# Patient Record
Sex: Male | Born: 1955 | Race: White | Hispanic: No | Marital: Single | Smoking: Former smoker
Health system: Southern US, Community
[De-identification: ages and names within clinical notes are randomized; demographics above are authoritative.]

## PROBLEM LIST (undated history)

## (undated) DIAGNOSIS — K5792 Diverticulitis of intestine, part unspecified, without perforation or abscess without bleeding: Secondary | ICD-10-CM

## (undated) DIAGNOSIS — G4730 Sleep apnea, unspecified: Secondary | ICD-10-CM

## (undated) DIAGNOSIS — E041 Nontoxic single thyroid nodule: Secondary | ICD-10-CM

## (undated) HISTORY — DX: Diverticulitis of intestine, part unspecified, without perforation or abscess without bleeding: K57.92

## (undated) HISTORY — DX: Nontoxic single thyroid nodule: E04.1

## (undated) HISTORY — PX: HERNIA REPAIR: SHX51

---

## 2012-02-11 ENCOUNTER — Other Ambulatory Visit: Payer: Self-pay | Admitting: Endocrinology

## 2012-02-11 DIAGNOSIS — E041 Nontoxic single thyroid nodule: Secondary | ICD-10-CM

## 2012-02-14 ENCOUNTER — Ambulatory Visit
Admission: RE | Admit: 2012-02-14 | Discharge: 2012-02-14 | Disposition: A | Discharge status: Home / Self Care | Payer: BC Managed Care – PPO | Source: Ambulatory Visit | Attending: Endocrinology | Admitting: Endocrinology

## 2012-02-14 ENCOUNTER — Other Ambulatory Visit (HOSPITAL_COMMUNITY)
Admission: RE | Admit: 2012-02-14 | Discharge: 2012-02-14 | Disposition: A | Discharge status: Home / Self Care | Payer: BC Managed Care – PPO | Source: Ambulatory Visit | Attending: Physician Assistant | Admitting: Physician Assistant

## 2012-02-14 DIAGNOSIS — E041 Nontoxic single thyroid nodule: Secondary | ICD-10-CM

## 2012-02-14 DIAGNOSIS — E049 Nontoxic goiter, unspecified: Secondary | ICD-10-CM | POA: Insufficient documentation

## 2012-03-19 ENCOUNTER — Other Ambulatory Visit: Payer: Self-pay | Admitting: Internal Medicine

## 2012-03-19 DIAGNOSIS — F17200 Nicotine dependence, unspecified, uncomplicated: Secondary | ICD-10-CM

## 2012-03-24 ENCOUNTER — Other Ambulatory Visit: Payer: Self-pay | Admitting: Internal Medicine

## 2012-03-24 DIAGNOSIS — Z1211 Encounter for screening for malignant neoplasm of colon: Secondary | ICD-10-CM

## 2012-03-31 ENCOUNTER — Other Ambulatory Visit: Payer: BC Managed Care – PPO

## 2012-03-31 ENCOUNTER — Ambulatory Visit
Admission: RE | Admit: 2012-03-31 | Discharge: 2012-03-31 | Disposition: A | Discharge status: Home / Self Care | Payer: BC Managed Care – PPO | Source: Ambulatory Visit | Attending: Internal Medicine | Admitting: Internal Medicine

## 2012-03-31 DIAGNOSIS — F17200 Nicotine dependence, unspecified, uncomplicated: Secondary | ICD-10-CM

## 2012-04-14 ENCOUNTER — Ambulatory Visit
Admission: RE | Admit: 2012-04-14 | Discharge: 2012-04-14 | Disposition: A | Discharge status: Home / Self Care | Payer: BC Managed Care – PPO | Source: Ambulatory Visit | Attending: Internal Medicine | Admitting: Internal Medicine

## 2012-04-14 DIAGNOSIS — Z1211 Encounter for screening for malignant neoplasm of colon: Secondary | ICD-10-CM

## 2012-06-10 ENCOUNTER — Other Ambulatory Visit: Payer: Self-pay | Admitting: Endocrinology

## 2012-06-10 DIAGNOSIS — E041 Nontoxic single thyroid nodule: Secondary | ICD-10-CM

## 2012-09-09 ENCOUNTER — Ambulatory Visit (INDEPENDENT_AMBULATORY_CARE_PROVIDER_SITE_OTHER): Payer: BC Managed Care – PPO | Admitting: Pulmonary Disease

## 2012-09-09 ENCOUNTER — Encounter: Payer: Self-pay | Admitting: Pulmonary Disease

## 2012-09-09 VITALS — BP 110/80 | HR 96 | Temp 98.1°F | Ht 72.0 in | Wt 273.0 lb

## 2012-09-09 DIAGNOSIS — G4733 Obstructive sleep apnea (adult) (pediatric): Secondary | ICD-10-CM

## 2012-09-09 NOTE — Progress Notes (Signed)
  Subjective:    Patient ID: Jonathan Hart, male    DOB: 07-May-1956, 56 y.o.   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 His main complaint is excessive daytime somnolence & fatigue. ESS 13/24. Reports naps when able. Reports sleepiness while watching TV, sitting & reading or lying down to rest i the afternoons. Bedtime is 11p-1A, latency 15-30 mins, sleeps on his back x 1 pillow, wakes up 3 times for BR visits, oob by 0900 feeling tired occasionally, with dryness of mouth , denies headaches. He has gained 30 lbs over the last 2 yrs. There is no history suggestive of cataplexy, sleep paralysis or parasomnias  He has a thyroid nodule being evaluated by Dr Lucianne Muss [name]    History reviewed. No pertinent past medical history. Thyroid cyst - dr Lucianne Muss [name])  Past Surgical History  Procedure Date  . Hernia repair     No Known Allergies  History   Social History  . Marital Status: Single    Spouse Name: N/A    Number of Children: N/A  . Years of Education: N/A   Occupational History  . Not on file.   Social History Main Topics  . Smoking status: Former Smoker -- 1.0 packs/day for 2 years    Types: Cigarettes    Quit date: 12/09/2010  . Smokeless tobacco: Not on file  . Alcohol Use: Yes     Comment: Socially  . Drug Use: No  . Sexually Active:    Other Topics Concern  . Not on file   Social History Narrative  . No narrative on file     Review of Systems  Constitutional: Positive for fatigue. Negative for fever and unexpected weight change.  HENT: Negative for ear pain, nosebleeds, congestion, sore throat, rhinorrhea, sneezing, trouble swallowing, dental problem, postnasal drip and sinus pressure.   Eyes: Negative for redness and itching.  Respiratory: Negative for cough, chest tightness, shortness of breath  and wheezing.   Cardiovascular: Negative for palpitations and leg swelling.  Gastrointestinal: Negative for nausea and vomiting.  Genitourinary: Negative for dysuria.  Musculoskeletal: Negative for joint swelling.  Skin: Negative for rash.  Neurological: Negative for headaches.  Hematological: Does not bruise/bleed easily.  Psychiatric/Behavioral: Negative for dysphoric mood. The patient is not nervous/anxious.        Objective:   Physical Exam  Gen. Pleasant, obese, in no distress, normal affect ENT - no lesions, no post nasal drip, class 2-3 airway Neck: No JVD, no thyromegaly, no carotid bruits Lungs: no use of accessory muscles, no dullness to percussion, decreased without rales or rhonchi  Cardiovascular: Rhythm regular, heart sounds  normal, no murmurs or gallops, no peripheral edema Abdomen: soft and non-tender, no hepatosplenomegaly, BS normal. Musculoskeletal: No deformities, no cyanosis or clubbing Neuro:  alert, non focal, no tremors       Assessment & Plan:

## 2012-09-09 NOTE — Patient Instructions (Signed)
Schedule sleep study in the lab

## 2012-09-09 NOTE — Assessment & Plan Note (Signed)
Given excessive daytime somnolence, narrow pharyngeal exam, witnessed apneas & loud snoring, obstructive sleep apnea is very likely & an overnight polysomnogram will be scheduled as a split study. The pathophysiology of obstructive sleep apnea , it's cardiovascular consequences & modes of treatment including CPAP were discused with the patient in detail & they evidenced understanding.  

## 2012-10-06 ENCOUNTER — Ambulatory Visit (HOSPITAL_BASED_OUTPATIENT_CLINIC_OR_DEPARTMENT_OTHER): Payer: BC Managed Care – PPO | Attending: Pulmonary Disease | Admitting: Radiology

## 2012-10-06 VITALS — Ht 72.0 in | Wt 275.0 lb

## 2012-10-06 DIAGNOSIS — G4733 Obstructive sleep apnea (adult) (pediatric): Secondary | ICD-10-CM

## 2012-10-09 ENCOUNTER — Other Ambulatory Visit: Payer: Self-pay | Admitting: Endocrinology

## 2012-10-09 DIAGNOSIS — E041 Nontoxic single thyroid nodule: Secondary | ICD-10-CM

## 2012-10-13 ENCOUNTER — Telehealth: Payer: Self-pay | Admitting: Pulmonary Disease

## 2012-10-13 DIAGNOSIS — G4733 Obstructive sleep apnea (adult) (pediatric): Secondary | ICD-10-CM

## 2012-10-13 NOTE — Telephone Encounter (Signed)
I have not - may take about 10 days to report this since I am away next few days Will look at it as soon as I return

## 2012-10-13 NOTE — Telephone Encounter (Signed)
Called and spoke with patient. Patient states he had sleep test done 10/07/12, and is requesting results of this.  Dr. Vassie Loll, have you seen these results? Please advise, thank you!

## 2012-10-13 NOTE — Telephone Encounter (Signed)
I spoke with pt and is aware of RA recs. 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 I advised will make Dr. Vassie Loll aware of this.

## 2012-10-15 ENCOUNTER — Other Ambulatory Visit: Payer: Self-pay | Admitting: Endocrinology

## 2012-10-15 ENCOUNTER — Ambulatory Visit
Admission: RE | Admit: 2012-10-15 | Discharge: 2012-10-15 | Disposition: A | Discharge status: Home / Self Care | Payer: BC Managed Care – PPO | Source: Ambulatory Visit | Attending: Endocrinology | Admitting: Endocrinology

## 2012-10-15 ENCOUNTER — Other Ambulatory Visit (HOSPITAL_COMMUNITY)
Admission: RE | Admit: 2012-10-15 | Discharge: 2012-10-15 | Disposition: A | Discharge status: Home / Self Care | Payer: BC Managed Care – PPO | Source: Ambulatory Visit | Attending: Interventional Radiology | Admitting: Interventional Radiology

## 2012-10-15 DIAGNOSIS — E041 Nontoxic single thyroid nodule: Secondary | ICD-10-CM

## 2012-10-15 DIAGNOSIS — E049 Nontoxic goiter, unspecified: Secondary | ICD-10-CM | POA: Insufficient documentation

## 2012-10-22 ENCOUNTER — Telehealth: Payer: Self-pay | Admitting: Pulmonary Disease

## 2012-10-22 NOTE — Telephone Encounter (Signed)
lmomtcb x1 will forward to RA to make him aware pt calling again regarding sleep study results.

## 2012-10-23 NOTE — Telephone Encounter (Signed)
Called, spoke with pt.  Informed him msg has been sent to RA to advise of sleep study results.  He verbalized understanding and would like this taken care of ASAP as this effects his employment.  He would also like a letter to state the results of the test so he can give to DOT and future employers.  He would like to pick letter up in [place].  Dr. Vassie Loll, pls advise.  Thank you.

## 2012-10-23 NOTE — Addendum Note (Signed)
Addended by: Tommie Sams on: 10/23/2012 04:52 PM   Modules accepted: Orders

## 2012-10-23 NOTE — Telephone Encounter (Signed)
lmomtcb x1 for --order has been sent

## 2012-10-23 NOTE — Telephone Encounter (Signed)
Pl see corresponding phone note

## 2012-10-23 NOTE — Telephone Encounter (Signed)
Study showed severe OSA , stopped breathing 85 times/h -Corrected by CPAP 12 cm Pl send Rx for CPAP 12 cm, quattro FF mask med, download in 4weeks Need to look at this report before I can send any letter to his job

## 2012-10-24 NOTE — Telephone Encounter (Addendum)
Pt returned call.  I informed him of results below per Dr. Vassie Loll.  He verbalized understanding of this and would like to make sure order is sent to DME ASAP.  He would like the DME Co to call him today regarding having this set up.  Advised I would make sure [facility] relayed this information to DME.  He verbalized understanding and Almyra Free will inform [facility] about pt's request.  Also, pt requesting appt card to be mailed to his home address for which I have verified reminding him of appt on Nov 11, 2012 at 3:30 pm with RA in [place].  Appt card placed in mail to pt.  Pt aware and voiced no further questions or concerns at this time.

## 2012-10-24 NOTE — Telephone Encounter (Signed)
lmomtcb x2 on home/cell # 

## 2012-10-28 ENCOUNTER — Telehealth: Payer: Self-pay | Admitting: Pulmonary Disease

## 2012-10-28 DIAGNOSIS — G4730 Sleep apnea, unspecified: Secondary | ICD-10-CM

## 2012-10-28 DIAGNOSIS — G4710 Hypersomnia, unspecified: Secondary | ICD-10-CM

## 2012-10-28 NOTE — Telephone Encounter (Signed)
Order for cpap changed to [facility] from [facility] Sos

## 2012-10-29 NOTE — Procedures (Signed)
NAME:  Jonathan Hart, Jonathan Hart           ACCOUNT NO.:  [id number]  MEDICAL RECORD NO.:  [id number]          PATIENT TYPE:  OUT  LOCATION:  SLEEP CENTER                 FACILITY:  [facility]  PHYSICIAN:  Oretha Milch, MD      DATE OF BIRTH:  1955-12-17  DATE OF STUDY:  10/06/2012                           NOCTURNAL POLYSOMNOGRAM  REFERRING PHYSICIAN:  Oretha Milch, MD  INDICATION FOR STUDY:  He is a 57 year old gentleman with excessive daytime fatigue, loud snoring, and witnessed apneas.  At the time of this study, he weighed 275 pounds with a height of 6 feet, BMI of 37, neck size of 17.5 inches.  EPWORTH SLEEPINESS SCORE:  9.  This intervention polysomnogram was performed with sleep technologist in attendance.  EEG, EOG, EMG, EKG, and respiratory parameters were recorded.  Sleep stages, arousals, limb movements, and respiratory data were scored according to criteria laid out by the American Academy of Sleep Medicine.  MEDICATIONS:  SLEEP ARCHITECTURE:  Lights out was at 9:47 p.m., lights on was at 5:05 a.m.  CPAP was initiated at 54 minutes past midnight.  During the diagnostic portion, total sleep time was 145 minutes with a sleep period time of 168 minutes and a sleep efficiency of 78%.  Sleep latency was 18 minutes.  Latency to REM sleep was 123 minutes.  Awake after sleep onset was 23 minutes.  Sleep stages of the percentage of total sleep time was N1 14%, N2 74%, N3 3.3%, and REM sleep 11.3% (16 minutes).  He spent 48 minutes supine.  During the titration portion, he had 65 minutes of supine REM sleep.  The longest REM sleep was around 2:30 a.m.  RESPIRATORY DATA:  During the diagnostic portion, there were 60 obstructive apneas, 3 central apneas, 21 mixed apneas, and 73 hypopneas with an apnea-hypopnea index of 65 events per hour.  Due to this degree of respiratory disturbance, CPAP was initiated at 5 cm and titrated to a final level of 13 cm.  Central apneas seem to emerge at 11  cm at a level of 12 cm for 59 minutes including 11 minutes of REM sleep, 1 central apnea, 1 hypopnea were noted with a lowest desaturation of 91%.  This appears to be the optimal level used during the study.  CPAP was titrated further to 13 cm due to snoring, but he only spent 13 minutes at this level.  Arousal Data:  The arousal index during the diagnostic portion was 73 events per hour.  Most of these were due to respiratory events.  During the titration portion, the arousal index was 13 events per hour, quite a few spontaneous arousals.  OXYGEN DATA:  The lowest desaturation during the diagnostic portion was 65% during REM sleep with a desaturation index of 53 events per hour. He maintained sats of 91% at a CPAP level of 12 cm and only spent 0.9 minutes with a saturation less than 88% during the titration portion.  CARDIAC DATA:  The low heart rate was 45 beats per minute.  The high heart rate recorded was an artifact.  No arrhythmias were noted.  Discussion:  He was desensitized with a medium full-face mask.  EPR of 1-  2 cm was used.  He tolerated CPAP fairly well.  No oxygen was required.  MOVEMENT-PARASOMNIA:none noted  IMPRESSIONS-RECOMMENDATIONS: 1. Severe obstructive sleep apnea with hypopneas causing sleep     fragmentation and oxygen desaturation. 2. This was corrected by CPAP of 12 cm with a medium full-face mask     and EPR level of 1 cm.  CPAP titration was optimal. 3. There was no evidence of cardiac arrhythmias, limb movements, or     behavioral disturbance during sleep.  Recommendation: 1. CPAP should be initiated at 12 cm with a medium full-face mask with     an EPR level of 1 cm.  Compliance should be monitored at this     level. 2. He should be cautioned against driving when sleepy. 3. He should be asked to avoid medications with sedative side effects.     Oretha Milch, MD    RVA/MEDQ  D:  10/28/2012 12:57:25  T:  10/29/2012 00:38:17  Job:  [id number]

## 2012-11-11 ENCOUNTER — Ambulatory Visit (INDEPENDENT_AMBULATORY_CARE_PROVIDER_SITE_OTHER): Payer: BC Managed Care – PPO | Admitting: Pulmonary Disease

## 2012-11-11 ENCOUNTER — Encounter: Payer: Self-pay | Admitting: Pulmonary Disease

## 2012-11-11 VITALS — BP 112/80 | HR 78 | Temp 97.1°F | Ht 72.0 in | Wt 280.8 lb

## 2012-11-11 DIAGNOSIS — G4733 Obstructive sleep apnea (adult) (pediatric): Secondary | ICD-10-CM

## 2012-11-11 NOTE — Assessment & Plan Note (Signed)
Your CPAP is set at 12 cm CPAP is effective -cuts down events to < 5/h Compliance of at least 6h / night is expected to see good results  Weight loss encouraged, compliance with goal of at least 4-6 hrs every night is the expectation. Advised against medications with sedative side effects Cautioned against driving when sleepy - understanding that sleepiness will vary on a day to day basis

## 2012-11-11 NOTE — Progress Notes (Signed)
  Subjective:    Patient ID: Jonathan Hart, male    DOB: 1956/07/05, 57 y.o.   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 His main complaint is excessive daytime somnolence & fatigue. ESS 13/24. Reports naps when able. Reports sleepiness while watching TV, sitting & reading or lying down to rest in the afternoons. Bedtime is 11p-1A, latency 15-30 mins, sleeps on his back x 1 pillow, wakes up 3 times for BR visits, oob by 0900 feeling tired occasionally, with dryness of mouth , denies headaches. He has gained 30 lbs over the last 2 yrs. He has a thyroid nodule being evaluated by Dr Jonathan Hart Jonathan Hart  11/11/2012  Study showed severe OSA , stopped breathing 85 times/h  -Corrected by CPAP 12 cm He would also like a letter to state the results of the test so he can give to DOT and future employers.  Pt states he is wearing CPAP everynight x 6-7 hrs a night. He does feel more rested during the day and is sleeping linger at night. Download - AHI corrected to 5/h by cpap 12 cm , mild leak, good usage over past week Mask ok, pressure ok, no dryness    Review of Systems neg for any significant sore throat, dysphagia, itching, sneezing, nasal congestion or excess/ purulent secretions, fever, chills, sweats, unintended wt loss, pleuritic or exertional cp, hempoptysis, orthopnea pnd or change in chronic leg swelling. Also denies presyncope, palpitations, heartburn, abdominal pain, nausea, vomiting, diarrhea or change in bowel or urinary habits, dysuria,hematuria, rash, arthralgias, visual complaints, headache, numbness weakness or ataxia.     Objective:   Physical Exam  Gen. Pleasant, obese, in no distress ENT - no lesions, no post nasal drip Neck: No JVD, no thyromegaly, no carotid bruits Lungs: no use of accessory muscles, no dullness to  percussion, decreased without rales or rhonchi  Cardiovascular: Rhythm regular, heart sounds  normal, no murmurs or gallops, no peripheral edema Musculoskeletal: No deformities, no cyanosis or clubbing , no tremors       Assessment & Plan:

## 2012-11-11 NOTE — Patient Instructions (Addendum)
Your CPAP is set at 12 cm CPAP is effective -cuts down events to < 5/h Compliance of at least 6h / night is expected to see good results

## 2012-11-18 ENCOUNTER — Encounter: Payer: Self-pay | Admitting: Pulmonary Disease

## 2012-11-24 ENCOUNTER — Other Ambulatory Visit: Payer: BC Managed Care – PPO

## 2012-11-25 ENCOUNTER — Other Ambulatory Visit: Payer: BC Managed Care – PPO

## 2012-11-26 ENCOUNTER — Telehealth: Payer: Self-pay | Admitting: Pulmonary Disease

## 2012-11-26 NOTE — Telephone Encounter (Signed)
Patient needs a letter from Dr Vassie Loll stating his condition.  He would be willing to pick it up at the [facility] office or you can mail it to him.  Please notify patient when that is done

## 2012-11-26 NOTE — Telephone Encounter (Signed)
'-   is under our care for obstructive sleep apnea that is corrected by CPAP therapy & he is compliant

## 2012-11-27 ENCOUNTER — Telehealth: Payer: Self-pay | Admitting: Pulmonary Disease

## 2012-11-27 ENCOUNTER — Encounter: Payer: Self-pay | Admitting: *Deleted

## 2012-11-27 NOTE — Telephone Encounter (Signed)
lmtcb x1 for pt. Letter has been done. Need to confirm mailing address.

## 2012-11-27 NOTE — Telephone Encounter (Signed)
I have placed letter out in the mail. Pt is aware and nothing further was needed

## 2013-01-08 ENCOUNTER — Ambulatory Visit: Payer: BC Managed Care – PPO | Admitting: Adult Health

## 2013-02-03 ENCOUNTER — Ambulatory Visit: Payer: BC Managed Care – PPO | Admitting: Adult Health

## 2013-02-26 ENCOUNTER — Telehealth: Payer: Self-pay | Admitting: Pulmonary Disease

## 2013-02-26 NOTE — Telephone Encounter (Signed)
I will forward as FYI. Carron Curie, CMA

## 2013-05-25 ENCOUNTER — Telehealth: Payer: Self-pay | Admitting: Pulmonary Disease

## 2013-05-25 NOTE — Telephone Encounter (Signed)
Download 05/14/13 on 12 cm - no residuals, no leak He is missing a few nights - try to be more consistent

## 2013-05-26 ENCOUNTER — Other Ambulatory Visit: Payer: BC Managed Care – PPO

## 2013-05-28 ENCOUNTER — Ambulatory Visit: Payer: BC Managed Care – PPO | Admitting: Endocrinology

## 2013-05-29 NOTE — Telephone Encounter (Signed)
LMTCB x1 for pt w/ family member

## 2013-06-10 NOTE — Telephone Encounter (Signed)
LMOMTCB x2 w/ family member

## 2013-06-11 ENCOUNTER — Encounter: Payer: Self-pay | Admitting: *Deleted

## 2013-06-11 ENCOUNTER — Telehealth: Payer: Self-pay | Admitting: Pulmonary Disease

## 2013-06-11 NOTE — Telephone Encounter (Signed)
I spoke with patient about results and he verbalized understanding and had no questions 

## 2013-06-11 NOTE — Telephone Encounter (Signed)
lmtcb x3 w/ family member and on cell phone. Will send pt a letter.

## 2013-06-11 NOTE — Telephone Encounter (Signed)
Oretha Milch, MD at 05/25/2013 12:12 PM    Status: Signed                   Download 05/14/13 on 12 cm - no residuals, no leak He is missing a few nights - try to be more consistent    lmtcb x1 on pt cell. Na on home #

## 2013-06-11 NOTE — Telephone Encounter (Signed)
Pt return call. Please call back at [phone]

## 2015-12-09 ENCOUNTER — Telehealth: Payer: Self-pay | Admitting: Pulmonary Disease

## 2015-12-09 NOTE — Telephone Encounter (Signed)
ATC received busy signal. WCB Pt not seen since 2014

## 2015-12-09 NOTE — Telephone Encounter (Signed)
[phone], pt calling to check status of this

## 2015-12-09 NOTE — Telephone Encounter (Signed)
Patient has not been seen since 2014.  Up to now, patient has been paying for his equipment out of pocket.  Patient now has insurance and needs the CPAP equipment through his insurance.  Advised patient that he would need Face to Face in order to have insurance cover CPAP equipment.  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  Scheduled patient to see TP on 3/27 at 9:15am.  Patient aware of appointment.  Nothing further needed.

## 2016-01-02 ENCOUNTER — Ambulatory Visit: Payer: Self-pay | Admitting: Adult Health

## 2016-02-06 ENCOUNTER — Institutional Professional Consult (permissible substitution): Payer: Self-pay | Admitting: Pulmonary Disease

## 2018-02-17 ENCOUNTER — Encounter: Payer: Self-pay | Admitting: Endocrinology

## 2021-03-07 ENCOUNTER — Emergency Department
Admission: EM | Admit: 2021-03-07 | Discharge: 2021-03-07 | Disposition: A | Discharge status: Home / Self Care | Payer: Self-pay | Source: Home / Self Care

## 2021-03-07 ENCOUNTER — Emergency Department (INDEPENDENT_AMBULATORY_CARE_PROVIDER_SITE_OTHER): Payer: Self-pay

## 2021-03-07 ENCOUNTER — Other Ambulatory Visit: Payer: Self-pay

## 2021-03-07 ENCOUNTER — Encounter: Payer: Self-pay | Admitting: Emergency Medicine

## 2021-03-07 DIAGNOSIS — M79631 Pain in right forearm: Secondary | ICD-10-CM

## 2021-03-07 DIAGNOSIS — M25521 Pain in right elbow: Secondary | ICD-10-CM

## 2021-03-07 DIAGNOSIS — S52124A Nondisplaced fracture of head of right radius, initial encounter for closed fracture: Secondary | ICD-10-CM

## 2021-03-07 DIAGNOSIS — M25531 Pain in right wrist: Secondary | ICD-10-CM

## 2021-03-07 DIAGNOSIS — W19XXXA Unspecified fall, initial encounter: Secondary | ICD-10-CM

## 2021-03-07 HISTORY — DX: Sleep apnea, unspecified: G47.30

## 2021-03-07 NOTE — ED Triage Notes (Signed)
Patient fell forward while playing basketball 4 weeks ago; still having pain with certain motions in wrist, forearm and elbow on right. Has had first 2 covid vaccinations.

## 2021-03-07 NOTE — Discharge Instructions (Signed)
Schedule to see the Orthopaedist for evaluation  

## 2021-03-09 ENCOUNTER — Telehealth: Payer: Self-pay | Admitting: Emergency Medicine

## 2021-03-09 NOTE — Telephone Encounter (Signed)
Call from [facility] regarding follow up - pt thought it was 30 days w/ the ortho doctor in [place]. RN will call and make an appointment w/ Dr Milagros Reap at [facility] ASAP. Call to office - appointment made for 0910 on 03/10/21. Call back to Casimiro Needle to inform him of appointment time & date. Pt confirmed he would be at the appointment

## 2021-03-10 ENCOUNTER — Encounter: Payer: Self-pay | Admitting: Family Medicine

## 2021-03-10 ENCOUNTER — Ambulatory Visit (INDEPENDENT_AMBULATORY_CARE_PROVIDER_SITE_OTHER): Payer: Self-pay | Admitting: Family Medicine

## 2021-03-10 ENCOUNTER — Other Ambulatory Visit: Payer: Self-pay

## 2021-03-10 VITALS — BP 158/90 | Ht 73.0 in | Wt 310.0 lb

## 2021-03-10 DIAGNOSIS — S52134A Nondisplaced fracture of neck of right radius, initial encounter for closed fracture: Secondary | ICD-10-CM

## 2021-03-10 NOTE — Progress Notes (Signed)
  Jonathan Hart - 65 y.o. male MRN [id number]  Date of birth: June 25, 1956  SUBJECTIVE:  Including CC & ROS.  No chief complaint on file.   Jonathan Hart is a 65 y.o. male that is presenting with right hand pain.  He was seen in urgent care and diagnosed with a fracture of the radial neck.  He reports his initial injury was on April 28.  He was seen because his elbow was still painful.  He lacks full range of motion..  Independent review of the right elbow x-ray from 5/31 shows an impacted radial neck fracture.   Review of Systems See HPI   HISTORY: Past Medical, Surgical, Social, and Family History Reviewed & Updated per EMR.   Pertinent Historical Findings include:  Past Medical History:  Diagnosis Date  . Sleep apnea     Past Surgical History:  Procedure Laterality Date  . HERNIA REPAIR      Family History  Problem Relation Age of Onset  . Breast cancer Mother     Social History   Socioeconomic History  . Marital status: Single    Spouse name: Not on file  . Number of children: Not on file  . Years of education: Not on file  . Highest education level: Not on file  Occupational History  . Not on file  Tobacco Use  . Smoking status: Former Smoker    Packs/day: 1.00    Years: 2.00    Pack years: 2.00    Types: Cigarettes    Quit date: 12/09/2010    Years since quitting: 10.2  . Smokeless tobacco: Never Used  Substance and Sexual Activity  . Alcohol use: Yes    Comment: Socially  . Drug use: No  . Sexual activity: Not on file  Other Topics Concern  . Not on file  Social History Narrative  . Not on file   Social Determinants of Health   Financial Resource Strain: Not on file  Food Insecurity: Not on file  Transportation Needs: Not on file  Physical Activity: Not on file  Stress: Not on file  Social Connections: Not on file  Intimate Partner Violence: Not on file     PHYSICAL EXAM:  VS: BP (!) 158/90 (BP Location: Left Arm, Patient Position:  Sitting, Cuff Size: Large)   Ht 6\' 1"  (1.854 m)   Wt (!) 310 lb (140.6 kg)   BMI 40.90 kg/m  Physical Exam Gen: NAD, alert, cooperative with exam, well-appearing MSK:  Right elbow: Limited flexion extension. Some limitation in pronation and supination. Normal grip strength. Neurovascular intact     ASSESSMENT & PLAN:   Closed nondisplaced fracture of neck of right radius Initial injury was on 4/28.  Still lacks range of motion.  Pain is minimal.  Possible for OCD lesion. -Counseled on home exercise therapy and supportive care. -Counseled on using sling as needed. -Could consider injection or physical therapy. -Could consider further imaging.

## 2021-03-10 NOTE — Assessment & Plan Note (Signed)
Initial injury was on 4/28.  Still lacks range of motion.  Pain is minimal.  Possible for OCD lesion. -Counseled on home exercise therapy and supportive care. -Counseled on using sling as needed. -Could consider injection or physical therapy. -Could consider further imaging.

## 2021-03-10 NOTE — Patient Instructions (Signed)
Nice to meet you ?Please try ice  ?Please try the exercises   ?Please send me a message in MyChart with any questions or updates.  ?Please see me back in 3 weeks.  ? ?--Dr. Rosana Farnell ? ?

## 2021-03-10 NOTE — ED Provider Notes (Signed)
[facility]    CSN: [id number] Arrival date & time: 03/07/21  1123      History   Chief Complaint Chief Complaint  Patient presents with  . Arm Injury    HPI Jonathan Hart is a 65 y.o. male.   The history is provided by the patient. No language interpreter was used.  Arm Injury Location:  Elbow Elbow location:  R elbow Injury: yes   Time since incident:  4 weeks Pain details:    Quality:  Aching   Radiates to:  Does not radiate   Severity:  Moderate   Duration:  4 weeks   Timing:  Constant Foreign body present:  No foreign bodies Relieved by:  None tried Pt reports he fell 4 weeks ago.  Pt complains of pain in his elbow.  Pt reports pain with moving.    Past Medical History:  Diagnosis Date  . Sleep apnea     Patient Active Problem List   Diagnosis Date Noted  . OSA (obstructive sleep apnea) 09/09/2012    Past Surgical History:  Procedure Laterality Date  . HERNIA REPAIR         Home Medications    Prior to Admission medications   Not on File    Family History Family History  Problem Relation Age of Onset  . Breast cancer Mother     Social History Social History   Tobacco Use  . Smoking status: Former Smoker    Packs/day: 1.00    Years: 2.00    Pack years: 2.00    Types: Cigarettes    Quit date: 12/09/2010    Years since quitting: 10.2  . Smokeless tobacco: Never Used  Substance Use Topics  . Alcohol use: Yes    Comment: Socially  . Drug use: No     Allergies   Patient has no known allergies.   Review of Systems Review of Systems  Musculoskeletal: Positive for joint swelling and myalgias.  All other systems reviewed and are negative.    Physical Exam Triage Vital Signs ED Triage Vitals  Enc Vitals Group     BP 03/07/21 1241 (!) 149/94     Pulse Rate 03/07/21 1241 71     Resp 03/07/21 1241 16     Temp 03/07/21 1241 98.4 F (36.9 C)     Temp Source 03/07/21 1241 Oral     SpO2 03/07/21 1241 96 %      Weight 03/07/21 1242 (!) 310 lb (140.6 kg)     Height 03/07/21 1242 6\' 1"  (1.854 m)     Head Circumference --      Peak Flow --      Pain Score 03/07/21 1241 6     Pain Loc --      Pain Edu? --      Excl. in GC? --    No data found.  Updated Vital Signs BP (!) 149/94 (BP Location: Left Arm)   Pulse 71   Temp 98.4 F (36.9 C) (Oral)   Resp 16   Ht 6\' 1"  (1.854 m)   Wt (!) 140.6 kg   SpO2 96%   BMI 40.90 kg/m   Visual Acuity Right Eye Distance:   Left Eye Distance:   Bilateral Distance:    Right Eye Near:   Left Eye Near:    Bilateral Near:     Physical Exam Vitals reviewed.  HENT:     Head: Normocephalic.  Musculoskeletal:  General: Swelling and tenderness present.     Comments: Tender right elbow, pain with movement of elbow, nv and ns intact   Skin:    General: Skin is warm.  Neurological:     General: No focal deficit present.     Mental Status: He is alert.  Psychiatric:        Mood and Affect: Mood normal.      UC Treatments / Results  Labs (all labs ordered are listed, but only abnormal results are displayed) Labs Reviewed - No data to display  EKG   Radiology No results found.  Procedures Procedures (including critical care time)  Medications Ordered in UC Medications - No data to display  Initial Impression / Assessment and Plan / UC Course  I have reviewed the triage vital signs and the nursing notes.  Pertinent labs & imaging results that were available during my care of the patient were reviewed by me and considered in my medical decision making (see chart for details).     MDM:  Xray shows radial head fracture.  Pt placed in a sling.  He is advised to follow up with Dr. Noreene Filbert Sports medicine.  Final Clinical Impressions(s) / UC Diagnoses   Final diagnoses:  Closed nondisplaced fracture of head of right radius, initial encounter     Discharge Instructions     Schedule to see the Orthopaedist for evaluation   ED  Prescriptions    None     PDMP not reviewed this encounter.  An After Visit Summary was printed and given to the patient.    Elson Areas, [place] 03/10/21 1220

## 2021-03-13 ENCOUNTER — Encounter: Payer: Self-pay | Admitting: Family Medicine

## 2021-04-03 ENCOUNTER — Encounter: Payer: Self-pay | Admitting: Family Medicine

## 2021-04-03 ENCOUNTER — Other Ambulatory Visit: Payer: Self-pay

## 2021-04-03 ENCOUNTER — Ambulatory Visit (INDEPENDENT_AMBULATORY_CARE_PROVIDER_SITE_OTHER): Payer: Self-pay | Admitting: Family Medicine

## 2021-04-03 DIAGNOSIS — S52134D Nondisplaced fracture of neck of right radius, subsequent encounter for closed fracture with routine healing: Secondary | ICD-10-CM

## 2021-04-03 NOTE — Progress Notes (Signed)
  Hardy Harcum - 65 y.o. male MRN [id number]  Date of birth: [birth year]  SUBJECTIVE:  Including CC & ROS.  No chief complaint on file.   Gleen Ripberger is a 65 y.o. male that is following up for his right elbow injury.  Has regained function.  Pain is minimal in nature.   Review of Systems See HPI   HISTORY: Past Medical, Surgical, Social, and Family History Reviewed & Updated per EMR.   Pertinent Historical Findings include:  Past Medical History:  Diagnosis Date   Sleep apnea     Past Surgical History:  Procedure Laterality Date   HERNIA REPAIR      Family History  Problem Relation Age of Onset   Breast cancer Mother     Social History   Socioeconomic History   Marital status: Single    Spouse name: Not on file   Number of children: Not on file   Years of education: Not on file   Highest education level: Not on file  Occupational History   Not on file  Tobacco Use   Smoking status: Former    Packs/day: 1.00    Years: 2.00    Pack years: 2.00    Types: Cigarettes    Quit date: 12/09/2010    Years since quitting: 10.3   Smokeless tobacco: Never  Substance and Sexual Activity   Alcohol use: Yes    Comment: Socially   Drug use: No   Sexual activity: Not on file  Other Topics Concern   Not on file  Social History Narrative   Not on file   Social Determinants of Health   Financial Resource Strain: Not on file  Food Insecurity: Not on file  Transportation Needs: Not on file  Physical Activity: Not on file  Stress: Not on file  Social Connections: Not on file  Intimate Partner Violence: Not on file     PHYSICAL EXAM:  VS: BP 128/82 (BP Location: Left Arm, Patient Position: Sitting, Cuff Size: Large)   Ht 6\' 1"  (1.854 m)   Wt (!) 310 lb (140.6 kg)   BMI 40.90 kg/m  Physical Exam Gen: NAD, alert, cooperative with exam, well-appearing MSK:  Right elbow: Has good range of motion. Lacks full flexion and full extension. Has some lack of  pronation. Neurovascular intact     ASSESSMENT & PLAN:   Closed nondisplaced fracture of neck of right radius Initial injury on 4/28.  Improving function.  Pain is minimal -Counseled on home exercise therapy and supportive care. -Could consider physical therapy or injection.

## 2021-04-03 NOTE — Assessment & Plan Note (Signed)
Initial injury on 4/28.  Improving function.  Pain is minimal -Counseled on home exercise therapy and supportive care. -Could consider physical therapy or injection.

## 2023-01-21 ENCOUNTER — Encounter: Payer: Self-pay | Admitting: *Deleted

## 2023-05-06 IMAGING — DX DG WRIST COMPLETE 3+V*R*
4 series · 4 of 4 positions shown · non-contrast
Comparison: None.

CLINICAL DATA: Fall playing basketball 4 weeks ago, persistent pain
in wrist, forearm, and elbow

EXAM:
RIGHT FOREARM - 2 VIEW; RIGHT ELBOW - COMPLETE 3+ VIEW; RIGHT WRIST
- COMPLETE 3+ VIEW

[wrist pa]
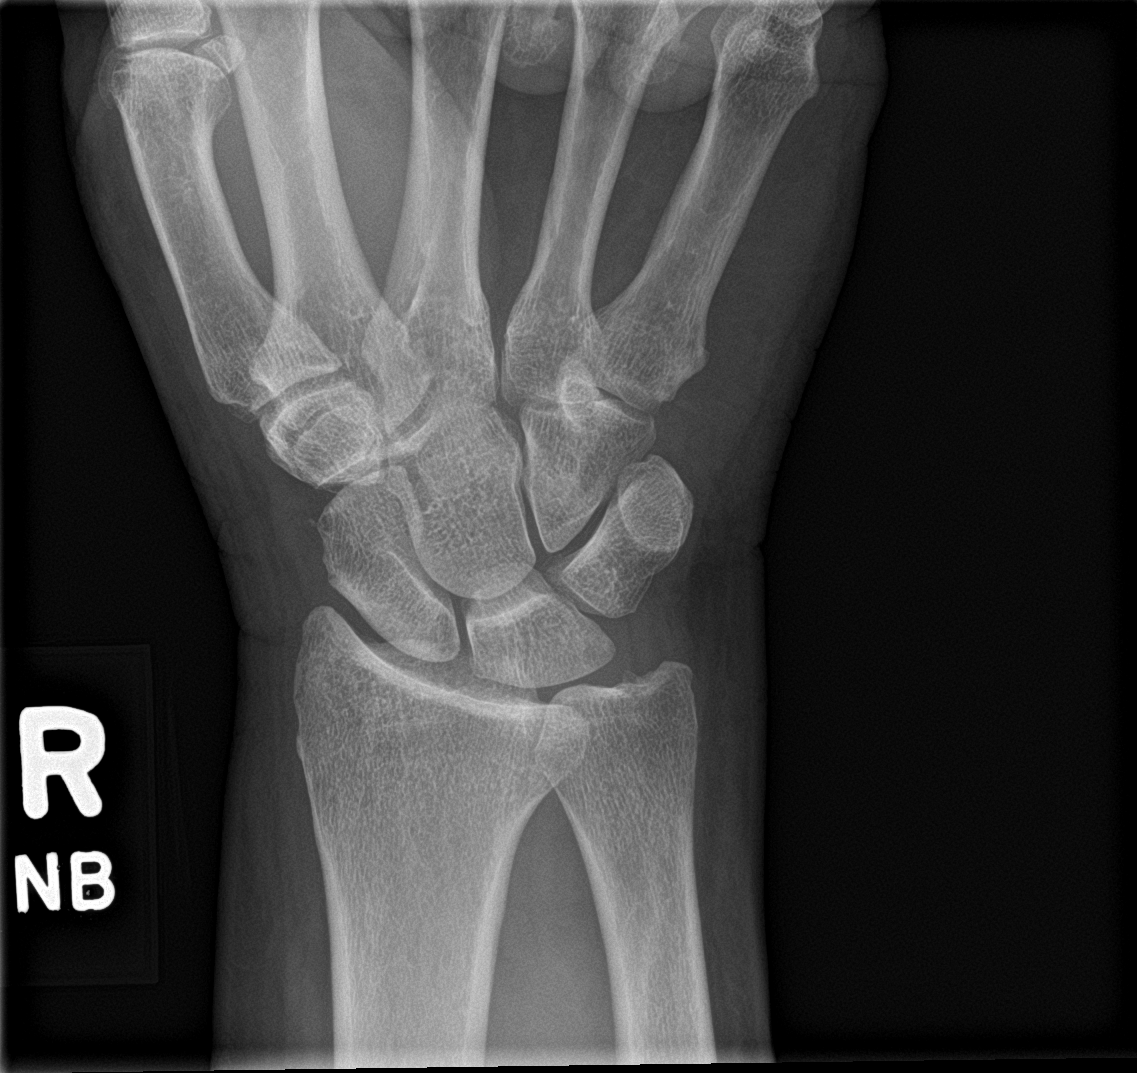

[wrist obl]
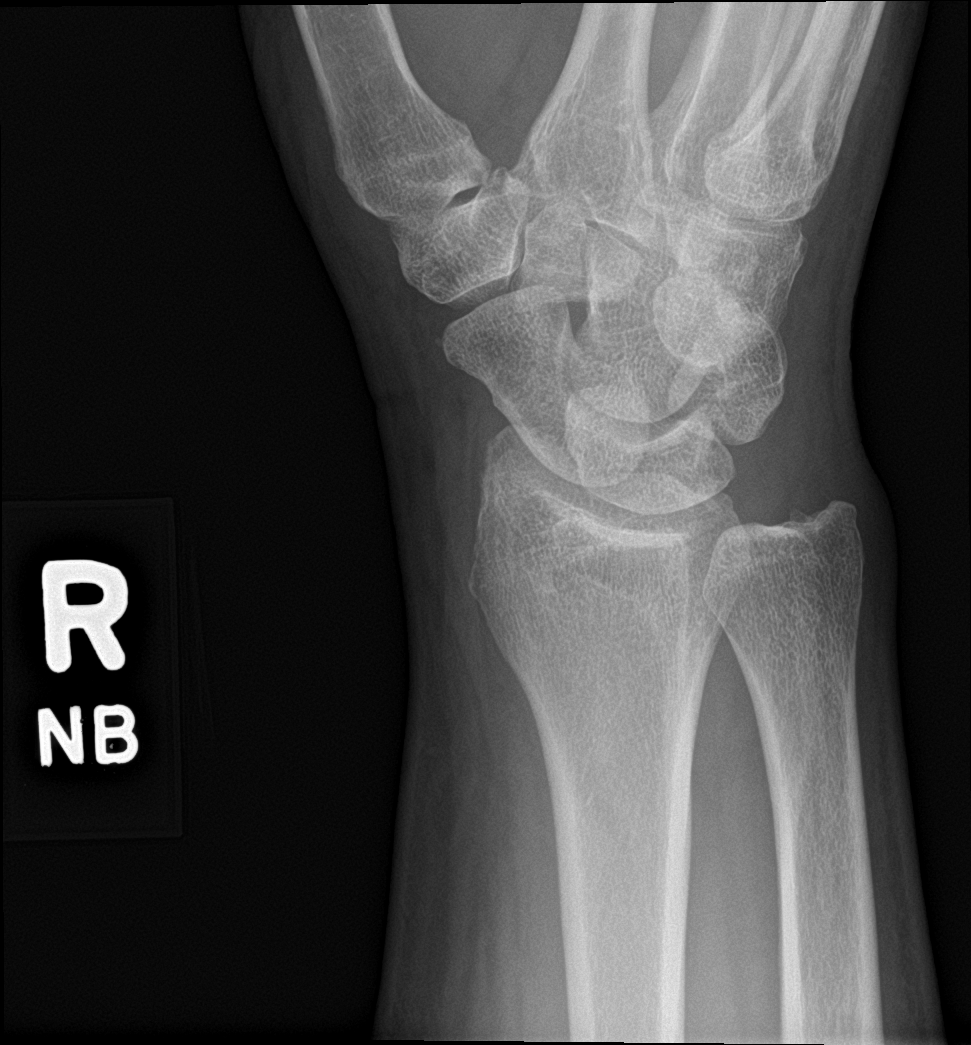

[wrist lat]
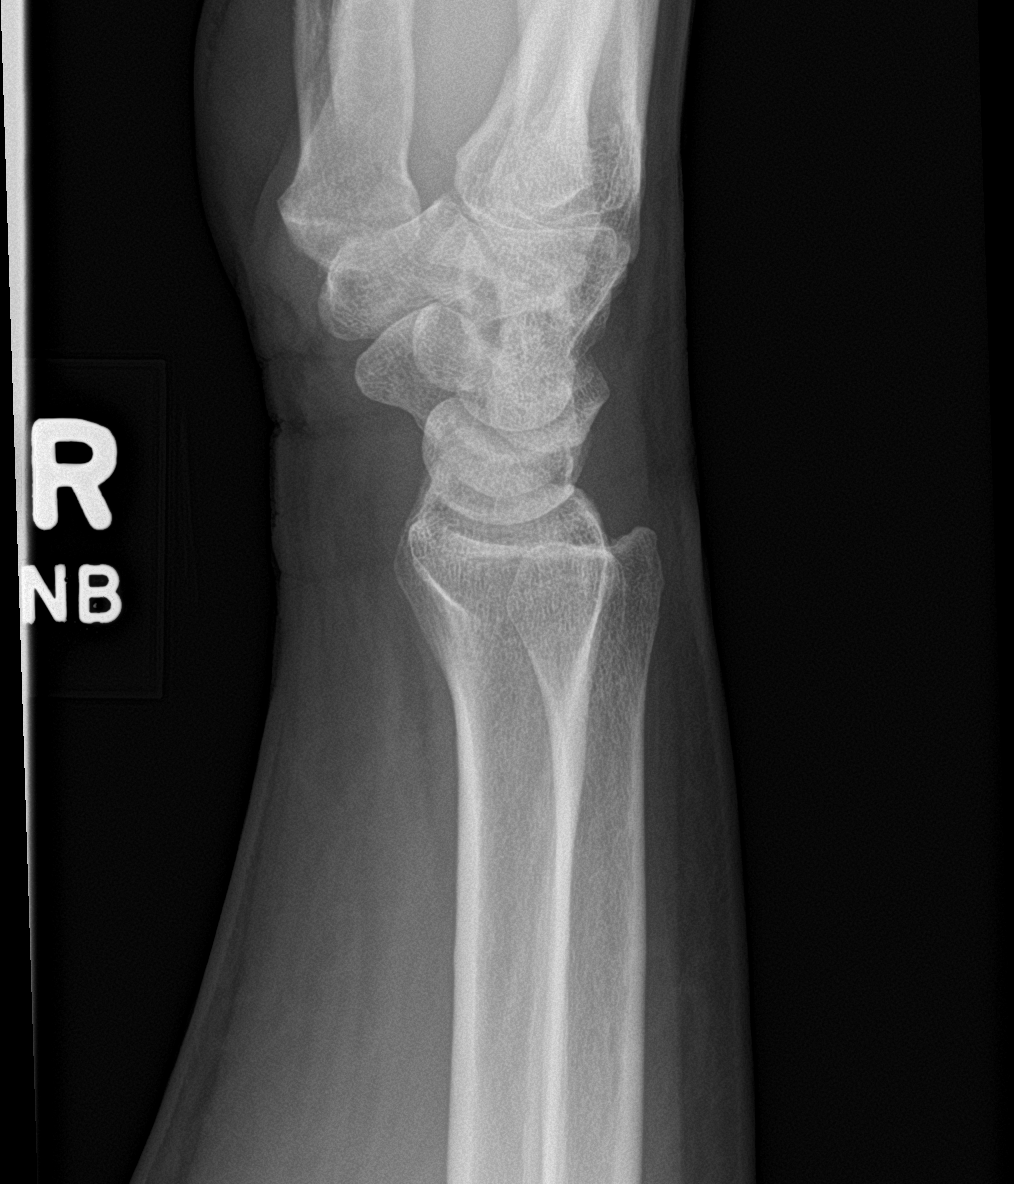

[wrist navicular]
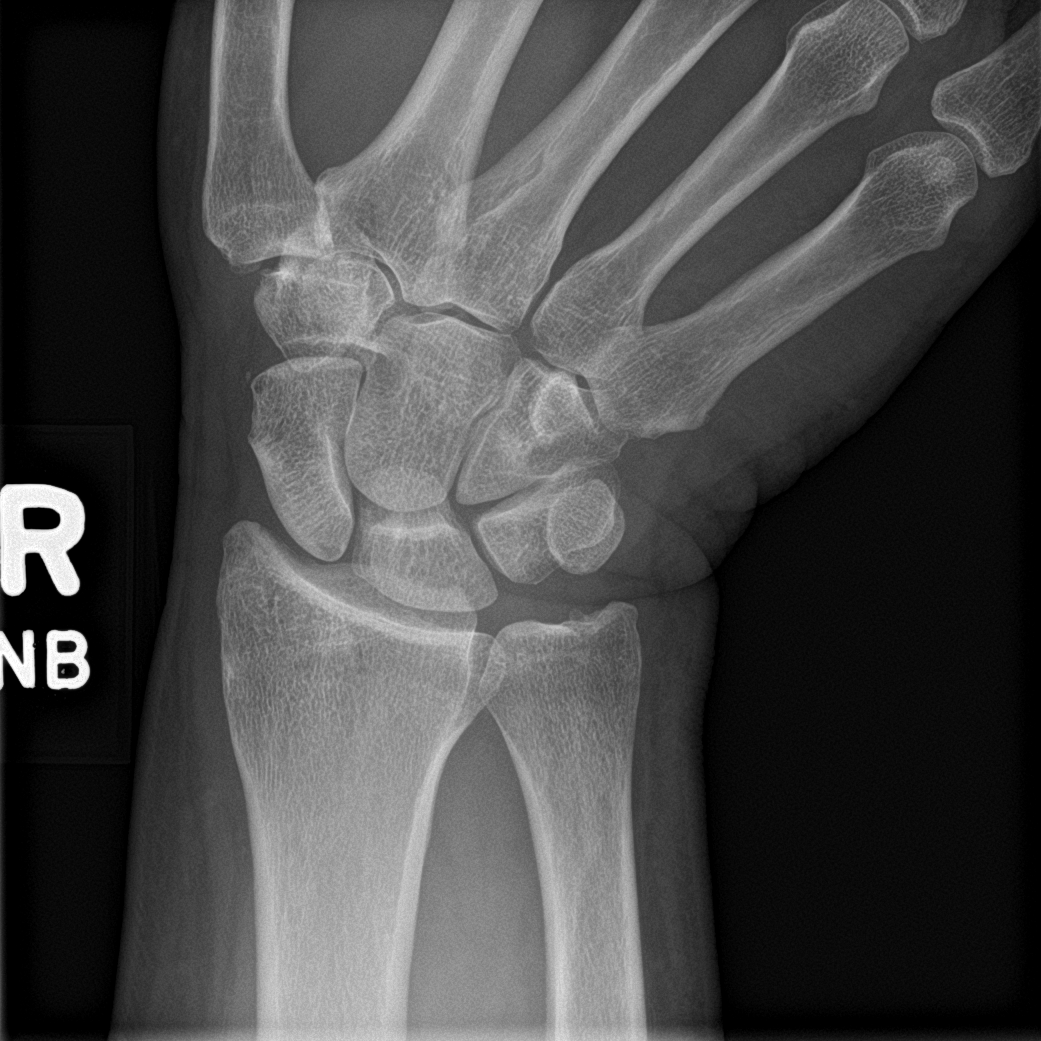

[4 of 4 positions shown; findings below may reference images not displayed]

FINDINGS: There is a minimally impacted fracture of the right radial neck. No
fracture or dislocation of the distal humerus or proximal ulna. No
elbow joint effusion. Elbow joint spaces preserved.

No fracture or dislocation of the distal right radius or ulna.

No fracture or dislocation of the right wrist. The carpus is
normally aligned.

Soft tissues are unremarkable.
IMPRESSION: 1. Minimally impacted fracture of the right radial neck.
2. No fracture or dislocation of the distal right radius or ulna.
3. No fracture or dislocation of the right wrist. The carpus is
normally aligned.

## 2024-02-10 ENCOUNTER — Encounter: Payer: Self-pay | Admitting: Urgent Care

## 2024-02-10 ENCOUNTER — Ambulatory Visit (INDEPENDENT_AMBULATORY_CARE_PROVIDER_SITE_OTHER): Admitting: Urgent Care

## 2024-02-10 VITALS — BP 145/91 | HR 89 | Ht 73.0 in | Wt 335.8 lb

## 2024-02-10 DIAGNOSIS — E049 Nontoxic goiter, unspecified: Secondary | ICD-10-CM | POA: Diagnosis not present

## 2024-02-10 DIAGNOSIS — Z1211 Encounter for screening for malignant neoplasm of colon: Secondary | ICD-10-CM | POA: Diagnosis not present

## 2024-02-10 DIAGNOSIS — Z8719 Personal history of other diseases of the digestive system: Secondary | ICD-10-CM

## 2024-02-10 DIAGNOSIS — R030 Elevated blood-pressure reading, without diagnosis of hypertension: Secondary | ICD-10-CM

## 2024-02-10 DIAGNOSIS — R918 Other nonspecific abnormal finding of lung field: Secondary | ICD-10-CM | POA: Diagnosis not present

## 2024-02-10 NOTE — Patient Instructions (Addendum)
 Please call [facility] to schedule your CT scan and Ultrasound. [address] [place], [place], [place] [address] Phone: [phone]  I have placed a referral to obtain a colonoscopy.  Please return in 1 month for a annual physical with fasting labs.

## 2024-02-10 NOTE — Progress Notes (Signed)
 New Patient Office Visit  Subjective:  Patient ID: Jonathan Hart, male    DOB: 1956-07-03  Age: 68 y.o. MRN: [id number]  CC:  Chief Complaint  Patient presents with   Establish Care    New pt est care. Pt would like referral for virtual colonoscopy and a lung CT. He has a cyst on his thyroid  and would like to see if he needs imaging. He would like labs if possible.    HPI Jonathan Hart presents to establish care.  Discussed the use of AI scribe software for clinical note transcription with the patient, who gave verbal consent to proceed.  History of Present Illness   Jonathan Hart is a 68 year old male who presents for establishing care and follow-up on lung and thyroid  nodules.  He has a history of a lung nodule first identified in 2013 during a virtual colonoscopy, which showed a non-calcified 3 mm nodule in the left lower lobe. He quit smoking in 2013 after many years of smoking, starting in high school. His last CT chest in 2021 showed the nodule had grown to 5 mm, with additional nodules less than 3 mm. He has a family history of lung cancer in an uncle.  He has a history of a large complex left thyroid  nodule first identified in 2013, which was drained twice. The nodule was benign and nonfunctional, with no thyroid  medication prescribed. No significant symptoms such as pain, difficulty swallowing, or breathing issues, although he notes his Adam's apple is displaced. He also notes his voice is changing in nature slightly. He has not had the nodule re-evaluated since 2013.  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 He has not had surgery for the diverticulum and reports no chronic gastrointestinal issues, though he occasionally noticed blood in his stool in the past.  He has a history of sleep apnea diagnosed around the same time as the thyroid  issue. He uses a CPAP machine and has had a sleep study. He  manages the condition independently without regular follow-up from a sleep specialist.  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 He experienced knee pain after retiring, which improved with rest.       Outpatient Encounter Medications as of 02/10/2024  Medication Sig   Cholecalciferol 1.25 MG (50000 UT) capsule Take 50,000 Units by mouth.   Misc Natural Products (PUMPKIN SEED OIL) CAPS Take by mouth.   Cinnamon 500 MG capsule Take 500 mg by mouth. (Patient not taking: Reported on 02/10/2024)   No facility-administered encounter medications on file as of 02/10/2024.    Past Medical History:  Diagnosis Date   Diverticulitis    Sleep apnea    Thyroid  cyst     Past Surgical History:  Procedure Laterality Date   HERNIA REPAIR      Family History  Problem Relation Age of Onset   Breast cancer Mother    COPD Brother     Social History   Socioeconomic History   Marital status: Single    Spouse name: Not on file   Number of children: Not on file   Years of education: Not on file   Highest education level: Not on file  Occupational History   Not on file  Tobacco Use   Smoking status: Former    Current  packs/day: 0.00    Average packs/day: 1 pack/day for 2.0 years (2.0 ttl pk-yrs)    Types: Cigarettes    Start date: 12/08/2008    Quit date: 12/09/2010    Years since quitting: 13.1   Smokeless tobacco: Never  Substance and Sexual Activity   Alcohol use: Yes    Comment: Socially   Drug use: No   Sexual activity: Not Currently  Other Topics Concern   Not on file  Social History Narrative   Not on file   Social Drivers of Health   Financial Resource Strain: Medium Risk (08/06/2023)   Received from [facility]   Overall Financial Resource Strain (CARDIA)    Difficulty of Paying Living Expenses: Somewhat hard  Food Insecurity: No Food  Insecurity (08/06/2023)   Received from [facility]   Hunger Vital Sign    Worried About Running Out of Food in the Last Year: Never true    Ran Out of Food in the Last Year: Never true  Transportation Needs: No Transportation Needs (08/06/2023)   Received from [facility] - Transportation    Lack of Transportation (Medical): No    Lack of Transportation (Non-Medical): No  Physical Activity: Insufficiently Active (08/06/2023)   Received from [facility]   Exercise Vital Sign    Days of Exercise per Week: 3 days    Minutes of Exercise per Session: 30 min  Stress: No Stress Concern Present (08/06/2023)   Received from [facility] - Occupational Stress Questionnaire    Feeling of Stress : Not at all  Social Connections: Somewhat Isolated (08/06/2023)   Received from [facility]   Social Network    How would you rate your social network (family, work, friends)?: Restricted participation with some degree of social isolation  Intimate Partner Violence: Not At Risk (08/06/2023)   Received from [facility]   HITS    Over the last 12 months how often did your partner physically hurt you?: Never    Over the last 12 months how often did your partner insult you or talk down to you?: Never    Over the last 12 months how often did your partner threaten you with physical harm?: Never    Over the last 12 months how often did your partner scream or curse at you?: Never    ROS: as noted in HPI  Objective:  BP (!) 145/91   Pulse 89   Ht 6\' 1"  (1.854 m)   Wt (!) 335 lb 12.8 oz (152.3 kg)   SpO2 96%   BMI 44.30 kg/m   Physical Exam Vitals and nursing note reviewed. Exam conducted with a chaperone present.  Constitutional:      General: He is not in acute distress.    Appearance: Normal appearance. He is obese. He is not ill-appearing, toxic-appearing or diaphoretic.  HENT:     Head: Normocephalic and atraumatic.     Right  Ear: Tympanic membrane, ear canal and external ear normal. There is no impacted cerumen.     Left Ear: Tympanic membrane, ear canal and external ear normal. There is no impacted cerumen.     Nose: Nose normal.     Mouth/Throat:     Mouth: Mucous membranes are moist.     Pharynx: Oropharynx is clear. No oropharyngeal exudate or posterior oropharyngeal erythema.  Eyes:     General: No scleral icterus.       Right eye: No  discharge.        Left eye: No discharge.     Extraocular Movements: Extraocular movements intact.     Pupils: Pupils are equal, round, and reactive to light.  Neck:     Thyroid : No thyroid  mass, thyromegaly or thyroid  tenderness.   Cardiovascular:     Rate and Rhythm: Normal rate and regular rhythm.     Pulses: Normal pulses.     Heart sounds: No murmur heard. Pulmonary:     Effort: Pulmonary effort is normal. No respiratory distress.     Breath sounds: Normal breath sounds. No stridor. No wheezing or rhonchi.  Musculoskeletal:     Cervical back: Normal range of motion and neck supple. No erythema, rigidity or tenderness. No muscular tenderness.     Right lower leg: No edema.     Left lower leg: No edema.  Lymphadenopathy:     Cervical: No cervical adenopathy.     Right cervical: No posterior cervical adenopathy.    Left cervical: No posterior cervical adenopathy.  Skin:    General: Skin is warm and dry.     Coloration: Skin is not jaundiced.     Findings: No bruising, erythema or rash.  Neurological:     General: No focal deficit present.     Mental Status: He is alert and oriented to person, place, and time.     Sensory: No sensory deficit.     Motor: No weakness.  Psychiatric:        Mood and Affect: Mood normal.        Behavior: Behavior normal.         Assessment & Plan:  Nodular goiter -     US  THYROID ; Future  History of Meckel's diverticulum -     Ambulatory referral to Gastroenterology  Multiple lung nodules on CT -     CT CHEST LUNG  CANCER SCREENING LOW DOSE WO CONTRAST; Future  Screen for colon cancer -     Ambulatory referral to Gastroenterology  Elevated blood pressure reading in office without diagnosis of hypertension  Assessment and Plan    Thyroid  nodule Large complex left thyroid  nodule with cystic areas, previously benign and nonfunctional. Slight voice change noted. Prefers drainage over surgery. Pt had endocrine consult in the past and felt this was not necessary. - Order thyroid  ultrasound at [facility] to assess current nodule characteristics. - Refer to interventional radiology for drainage post-ultrasound pending results.  Pulmonary nodules Non-calcified 5 mm nodule in left lower lobe, increased from 3 mm in 2013. High risk due to smoking and family history of lung cancer. Monitoring necessary. - Order CT chest at [facility] to monitor nodule changes.  Sleep apnea Managed with CPAP, effective use reported. No current follow-up with sleep specialist, prefers minimal monitoring. - Continue current use of CPAP machine.   History of Meckels Diverticulum Pt with hx of this, no tx was performed. Had virtual colonoscopy in 2013, due for repeat screening. Pt agrees to perform standard colonoscopy  Elevated BP reading Noted on both evaluations in office today. Will monitor, discuss upon follow up if remains elevated.      Return in about 4 weeks (around 03/09/2024).   Jacqualyn Sedgwick Paulene Boron, PA 4x2.5"

## 2024-02-12 ENCOUNTER — Ambulatory Visit
Admission: RE | Admit: 2024-02-12 | Discharge: 2024-02-12 | Discharge status: Home / Self Care | Source: Ambulatory Visit | Attending: Urgent Care | Admitting: Urgent Care

## 2024-02-12 DIAGNOSIS — E049 Nontoxic goiter, unspecified: Secondary | ICD-10-CM

## 2024-02-14 ENCOUNTER — Ambulatory Visit
Admission: RE | Admit: 2024-02-14 | Discharge: 2024-02-14 | Disposition: A | Discharge status: Home / Self Care | Source: Ambulatory Visit | Attending: Urgent Care | Admitting: Urgent Care

## 2024-02-14 DIAGNOSIS — R918 Other nonspecific abnormal finding of lung field: Secondary | ICD-10-CM

## 2024-02-16 ENCOUNTER — Encounter: Payer: Self-pay | Admitting: Urgent Care

## 2024-02-16 DIAGNOSIS — E049 Nontoxic goiter, unspecified: Secondary | ICD-10-CM

## 2024-02-17 ENCOUNTER — Other Ambulatory Visit

## 2024-02-17 DIAGNOSIS — E049 Nontoxic goiter, unspecified: Secondary | ICD-10-CM

## 2024-02-17 NOTE — Telephone Encounter (Signed)
Previously routed to PCP 

## 2024-02-18 ENCOUNTER — Ambulatory Visit: Payer: Self-pay | Admitting: Urgent Care

## 2024-02-18 LAB — CBC WITH DIFFERENTIAL/PLATELET
Absolute Lymphocytes: 1992 {cells}/uL (ref 850–3900)
Absolute Monocytes: 727 {cells}/uL (ref 200–950)
Basophils Absolute: 69 {cells}/uL (ref 0–200)
Basophils Relative: 1.5 %
Eosinophils Absolute: 189 {cells}/uL (ref 15–500)
Eosinophils Relative: 4.1 %
HCT: 45.7 % (ref 38.5–50.0)
Hemoglobin: 15.1 g/dL (ref 13.2–17.1)
MCH: 31.5 pg (ref 27.0–33.0)
MCHC: 33 g/dL (ref 32.0–36.0)
MCV: 95.4 fL (ref 80.0–100.0)
MPV: 12.9 fL — ABNORMAL HIGH (ref 7.5–12.5)
Monocytes Relative: 15.8 %
Neutro Abs: 1624 {cells}/uL (ref 1500–7800)
Neutrophils Relative %: 35.3 %
Platelets: 157 10*3/uL (ref 140–400)
RBC: 4.79 10*6/uL (ref 4.20–5.80)
RDW: 13 % (ref 11.0–15.0)
Total Lymphocyte: 43.3 %
WBC: 4.6 10*3/uL (ref 3.8–10.8)

## 2024-02-18 LAB — COMPREHENSIVE METABOLIC PANEL WITH GFR
AG Ratio: 1.5 (calc) (ref 1.0–2.5)
ALT: 19 U/L (ref 9–46)
AST: 17 U/L (ref 10–35)
Albumin: 4.2 g/dL (ref 3.6–5.1)
Alkaline phosphatase (APISO): 75 U/L (ref 35–144)
BUN: 11 mg/dL (ref 7–25)
CO2: 23 mmol/L (ref 20–32)
Calcium: 9.6 mg/dL (ref 8.6–10.3)
Chloride: 107 mmol/L (ref 98–110)
Creat: 0.92 mg/dL (ref 0.70–1.35)
Globulin: 2.8 g/dL (ref 1.9–3.7)
Glucose, Bld: 106 mg/dL — ABNORMAL HIGH (ref 65–99)
Potassium: 4.4 mmol/L (ref 3.5–5.3)
Sodium: 143 mmol/L (ref 135–146)
Total Bilirubin: 0.4 mg/dL (ref 0.2–1.2)
Total Protein: 7 g/dL (ref 6.1–8.1)
eGFR: 91 mL/min/{1.73_m2} (ref >=60)

## 2024-02-18 LAB — TSH: TSH: 1.13 m[IU]/L (ref 0.40–4.50)

## 2024-02-18 LAB — THYROGLOBULIN ANTIBODY: Thyroglobulin Ab: 1 [IU]/mL (ref <=1)

## 2024-02-18 LAB — T4, FREE: Free T4: 1.2 ng/dL (ref 0.8–1.8)

## 2024-02-18 LAB — T3: T3, Total: 107 ng/dL (ref 76–181)

## 2024-02-19 NOTE — Telephone Encounter (Signed)
 No further action needed.

## 2024-02-24 ENCOUNTER — Ambulatory Visit
Admission: RE | Admit: 2024-02-24 | Discharge: 2024-02-24 | Disposition: A | Discharge status: Home / Self Care | Source: Ambulatory Visit | Attending: Urgent Care | Admitting: Urgent Care

## 2024-02-24 ENCOUNTER — Encounter: Payer: Self-pay | Admitting: Radiology

## 2024-02-24 DIAGNOSIS — E049 Nontoxic goiter, unspecified: Secondary | ICD-10-CM

## 2024-02-24 MED ORDER — IOPAMIDOL (ISOVUE-300) INJECTION 61%
100.0000 mL | Freq: Once | INTRAVENOUS | Status: AC | PRN
  Administered 2024-02-24: 100 mL via INTRAVENOUS

## 2024-03-09 ENCOUNTER — Ambulatory Visit: Admitting: Urgent Care

## 2024-03-09 ENCOUNTER — Ambulatory Visit: Payer: Self-pay | Admitting: Urgent Care

## 2024-03-10 ENCOUNTER — Telehealth: Payer: Self-pay | Admitting: Urgent Care

## 2024-03-10 NOTE — Telephone Encounter (Signed)
 Sent as [name], provider has already responded

## 2024-03-10 NOTE — Telephone Encounter (Signed)
 I spoke with Dr. Leslye Rast today in regards to the complex thyroid  nodule noted, specifically regarding best course of action given its size. Recommendations were as follows: "I do think he would be best managed with at least a lobectomy. The calcification can be benign, simply due to the size of the goiter. An FNA is going to have some degree of sampling error, just based on the size of the lesion, but might help steer him toward surgical management. I'd also want some kind of molecular diagnostic testing, like ThyroSeq done, to evaluate for any genetic mutations that might put the nodule at higher risk of malignancy if the FNA biopsy is indeterminate. I think he would be best served by being seen in an academic setting for all of that, since radiology doesn't send the molecular studies when they do biopsies. Dr. Letha Rav at [facility] or Dr. Burdette Carolin at [facility] are surgeons I know and trust to do the right thing."  Will further discuss thyroid  US / CT results upon follow up on 03/13/24.

## 2024-03-13 ENCOUNTER — Ambulatory Visit: Payer: Self-pay | Admitting: Urgent Care

## 2024-03-13 ENCOUNTER — Encounter: Payer: Self-pay | Admitting: Urgent Care

## 2024-03-13 ENCOUNTER — Ambulatory Visit: Admitting: Urgent Care

## 2024-03-13 VITALS — BP 132/82 | HR 68 | Wt 335.8 lb

## 2024-03-13 DIAGNOSIS — G4733 Obstructive sleep apnea (adult) (pediatric): Secondary | ICD-10-CM

## 2024-03-13 DIAGNOSIS — Z13220 Encounter for screening for lipoid disorders: Secondary | ICD-10-CM | POA: Diagnosis not present

## 2024-03-13 DIAGNOSIS — M51369 Other intervertebral disc degeneration, lumbar region without mention of lumbar back pain or lower extremity pain: Secondary | ICD-10-CM

## 2024-03-13 DIAGNOSIS — Z1211 Encounter for screening for malignant neoplasm of colon: Secondary | ICD-10-CM | POA: Diagnosis not present

## 2024-03-13 DIAGNOSIS — R600 Localized edema: Secondary | ICD-10-CM | POA: Insufficient documentation

## 2024-03-13 DIAGNOSIS — E049 Nontoxic goiter, unspecified: Secondary | ICD-10-CM | POA: Diagnosis not present

## 2024-03-13 DIAGNOSIS — R7309 Other abnormal glucose: Secondary | ICD-10-CM | POA: Diagnosis not present

## 2024-03-13 DIAGNOSIS — R918 Other nonspecific abnormal finding of lung field: Secondary | ICD-10-CM | POA: Diagnosis not present

## 2024-03-13 DIAGNOSIS — J432 Centrilobular emphysema: Secondary | ICD-10-CM

## 2024-03-13 LAB — LIPID PANEL
Cholesterol: 151 mg/dL (ref 0–200)
HDL: 57.9 mg/dL (ref >39.00)
LDL Cholesterol: 82 mg/dL (ref 0–99)
NonHDL: 93.53
Total CHOL/HDL Ratio: 3
Triglycerides: 59 mg/dL (ref 0.0–149.0)
VLDL: 11.8 mg/dL (ref 0.0–40.0)

## 2024-03-13 LAB — HEMOGLOBIN A1C: Hgb A1c MFr Bld: 6 % (ref 4.6–6.5)

## 2024-03-13 NOTE — Patient Instructions (Addendum)
 Dr Letha Rav, [facility] in [place]. [address] [facility] 4th Floor [place], [place] [address] Phone: [phone] Call the above number to get an assessment and consultation for your thyroid .   We drew your lipid panel and A1C today, results will be available on Mychart.  Please also contact Lynne Sat to obtain colonoscopy.  Schedule annual physical with fasting labs in May 2026 at [facility] office [facility] at [facility] [address] [place], [place] [address] Phone: [phone]

## 2024-03-13 NOTE — Progress Notes (Signed)
 Established Patient Office Visit  Subjective:  Patient ID: Jonathan Hart, male    DOB: 09-15-1956  Age: 68 y.o. MRN: [id number]  Chief Complaint  Patient presents with   Follow-up    Follow up on Thyroid  nodule pt would like to discuss options.    HPI  Discussed the use of AI scribe software for clinical note transcription with the patient, who gave verbal consent to proceed.  History of Present Illness   [name] is a 68 year old male with a thyroid  nodule who presents for evaluation of its growth and potential impact on surrounding structures.  He has a history of a thyroid  nodule first identified approximately 12 years ago, which was previously aspirated and found to be benign. The nodule has increased in size over time. Initially described as having multiple cystic areas, recent imaging indicates it is now a solid mass with no cysts present. The nodule was measured at 7x6x4 cm in 2013 and has grown to 8x4.5x4.5 cm. The volume of the nodule is now 161 mL, compared to 20 mL previously aspirated.  He has a history of obstructive sleep apnea for which he uses a CPAP machine. He also has a history of smoking since high school, quitting in 2013, and has been diagnosed with emphysema. No current pulmonary symptoms such as chest tightness, shortness of breath, or wheezing, although he notes occasional shortness of breath during physical activity, which he attributes to being overweight.  He mentions a past virtual colonoscopy in 2013 that noted an elongated and tortuous sigmoid colon. He is considering a follow-up colonoscopy.  He reports occasional swelling in his foot over the past three to four months, which worsens by the end of the day. No associated pain or morning swelling.  He monitors his blood pressure at home, noting fluctuating readings but nothing above 144/80-83 mmHg.       Patient Active Problem List   Diagnosis Date Noted   Nodular goiter 03/13/2024    Multiple lung nodules on CT 03/13/2024   Abnormal glucose 03/13/2024   Peripheral edema 03/13/2024   Degeneration of intervertebral disc of lumbar region without discogenic back pain or lower extremity pain 03/13/2024   Centrilobular emphysema (HCC) 03/13/2024   Closed nondisplaced fracture of neck of right radius 03/10/2021   OSA (obstructive sleep apnea) 09/09/2012   Past Medical History:  Diagnosis Date   Diverticulitis    Sleep apnea    Thyroid  cyst    Past Surgical History:  Procedure Laterality Date   HERNIA REPAIR     Social History   Socioeconomic History   Marital status: Single    Spouse name: Not on file   Number of children: Not on file   Years of education: Not on file   Highest education level: Not on file  Occupational History   Not on file  Tobacco Use   Smoking status: Former    Current packs/day: 0.00    Average packs/day: 1 pack/day for 2.0 years (2.0 ttl pk-yrs)    Types: Cigarettes    Start date: 12/08/2008    Quit date: 12/09/2010    Years since quitting: 13.2   Smokeless tobacco: Never  Substance and Sexual Activity   Alcohol use: Yes    Comment: Socially   Drug use: No   Sexual activity: Not Currently  Other Topics Concern   Not on file  Social History Narrative   Not on file   Social Drivers of Corporate investment banker  Strain: Medium Risk (08/06/2023)   Received from [facility]   Overall Financial Resource Strain (CARDIA)    Difficulty of Paying Living Expenses: Somewhat hard  Food Insecurity: No Food Insecurity (08/06/2023)   Received from [facility]   Hunger Vital Sign    Worried About Running Out of Food in the Last Year: Never true    Ran Out of Food in the Last Year: Never true  Transportation Needs: No Transportation Needs (08/06/2023)   Received from [facility] - Transportation    Lack of Transportation (Medical): No    Lack of Transportation (Non-Medical): No  Physical Activity: Insufficiently Active  (08/06/2023)   Received from [facility]   Exercise Vital Sign    Days of Exercise per Week: 3 days    Minutes of Exercise per Session: 30 min  Stress: No Stress Concern Present (08/06/2023)   Received from [facility] - Occupational Stress Questionnaire    Feeling of Stress : Not at all  Social Connections: Somewhat Isolated (08/06/2023)   Received from [facility]   Social Network    How would you rate your social network (family, work, friends)?: Restricted participation with some degree of social isolation  Intimate Partner Violence: Not At Risk (08/06/2023)   Received from [facility]   HITS    Over the last 12 months how often did your partner physically hurt you?: Never    Over the last 12 months how often did your partner insult you or talk down to you?: Never    Over the last 12 months how often did your partner threaten you with physical harm?: Never    Over the last 12 months how often did your partner scream or curse at you?: Never      ROS: as noted in HPI  Objective:     BP 132/82   Pulse 68   Wt (!) 335 lb 12.8 oz (152.3 kg)   SpO2 97%   BMI 44.30 kg/m  BP Readings from Last 3 Encounters:  03/13/24 132/82  02/10/24 (!) 145/91  04/03/21 128/82   Wt Readings from Last 3 Encounters:  03/13/24 (!) 335 lb 12.8 oz (152.3 kg)  02/10/24 (!) 335 lb 12.8 oz (152.3 kg)  04/03/21 (!) 310 lb (140.6 kg)      Physical Exam Vitals and nursing note reviewed. Exam conducted with a chaperone present.  Constitutional:      General: He is not in acute distress.    Appearance: Normal appearance. He is obese. He is not ill-appearing, toxic-appearing or diaphoretic.  HENT:     Head: Normocephalic and atraumatic.     Nose: Nose normal.     Mouth/Throat:     Mouth: Mucous membranes are moist.     Pharynx: Oropharynx is clear. No oropharyngeal exudate or posterior oropharyngeal erythema.  Eyes:     General: No scleral  icterus.       Right eye: No discharge.        Left eye: No discharge.     Extraocular Movements: Extraocular movements intact.     Pupils: Pupils are equal, round, and reactive to light.  Neck:     Thyroid : No thyroid  mass, thyromegaly or thyroid  tenderness.   Cardiovascular:     Rate and Rhythm: Normal rate and regular rhythm.     Pulses: Normal pulses.     Heart sounds: No murmur heard. Pulmonary:     Effort: Pulmonary  effort is normal. No respiratory distress.     Breath sounds: Normal breath sounds. No stridor. No wheezing or rhonchi.  Musculoskeletal:     Cervical back: Normal range of motion and neck supple. No erythema, rigidity or tenderness. No muscular tenderness.     Comments: Scant non-pitting edema to L foot dorsally without erythema, warmth or rash  Lymphadenopathy:     Cervical: No cervical adenopathy.     Right cervical: No posterior cervical adenopathy.    Left cervical: No posterior cervical adenopathy.  Skin:    General: Skin is warm and dry.     Coloration: Skin is not jaundiced.     Findings: No bruising, erythema or rash.  Neurological:     General: No focal deficit present.     Mental Status: He is alert and oriented to person, place, and time.     Sensory: No sensory deficit.     Motor: No weakness.  Psychiatric:        Mood and Affect: Mood normal.        Behavior: Behavior normal.        Last CBC Lab Results  Component Value Date   WBC 4.6 02/17/2024   HGB 15.1 02/17/2024   HCT 45.7 02/17/2024   MCV 95.4 02/17/2024   MCH 31.5 02/17/2024   RDW 13.0 02/17/2024   PLT 157 02/17/2024   Last metabolic panel Lab Results  Component Value Date   GLUCOSE 106 (H) 02/17/2024   NA 143 02/17/2024   K 4.4 02/17/2024   CL 107 02/17/2024   CO2 23 02/17/2024   BUN 11 02/17/2024   CREATININE 0.92 02/17/2024   EGFR 91 02/17/2024   CALCIUM 9.6 02/17/2024   PROT 7.0 02/17/2024   BILITOT 0.4 02/17/2024   AST 17 02/17/2024   ALT 19 02/17/2024    Last lipids Lab Results  Component Value Date   CHOL 151 03/13/2024   HDL 57.90 03/13/2024   LDLCALC 82 03/13/2024   TRIG 59.0 03/13/2024   CHOLHDL 3 03/13/2024   Last hemoglobin A1c Lab Results  Component Value Date   HGBA1C 6.0 03/13/2024   Last thyroid  functions Lab Results  Component Value Date   TSH 1.13 02/17/2024   T3TOTAL 107 02/17/2024   Last vitamin D No results found for: "25OHVITD2", "25OHVITD3", "VD25OH" Last vitamin B12 and Folate No results found for: "VITAMINB12", "FOLATE"    The 10-year ASCVD risk score (Arnett DK, et al., 2019) is: 13.2%  Assessment & Plan:  Nodular goiter -     Ambulatory referral to Endocrinology  Multiple lung nodules on CT  Screen for colon cancer -     Ambulatory referral to Gastroenterology  Lipid screening -     Lipid panel  Abnormal glucose -     Hemoglobin A1c  Peripheral edema  OSA (obstructive sleep apnea)  Degeneration of intervertebral disc of lumbar region without discogenic back pain or lower extremity pain  Centrilobular emphysema (HCC)  Assessment and Plan    Thyroid  Nodule Solid thyroid  nodule with benign cytology on aspiration 12+ years ago; recently grown in size now displacing esophagus, trachea, and carotid artery. Discussed molecular diagnostic testing (ThyroSec) and specialist consultation. - Refer to specialist for consultation and potential molecular diagnostic testing (ThyroSec). Referral placed to Dr Letha Rav at [facility] in [place]. - Monitor nodule size and symptoms regularly.  Hypertension Home blood pressure readings labile; Current readings in office does not necessitate medication. - Encourage lifestyle modifications such as reducing salt intake  and increasing physical activity. - Monitor blood pressure regularly.  Obstructive Sleep Apnea Uses CPAP machine. Heart size at upper limits of normal, likely due to past untreated sleep apnea. - Continue CPAP  therapy.  Emphysema Centrilobular emphysema from past smoking. Asymptomatic, no intervention needed. - Monitor for any pulmonary symptoms.  Degenerative Disc Disease Age-related degenerative changes in spine, not problematic unless symptomatic. - No specific intervention required unless symptoms develop.  General Health Maintenance Discussed colorectal cancer screening options.  - Schedule colonoscopy for colorectal cancer screening. - Perform A1c and lipid panel tests today as pt fasting. - Schedule annual physical in May 2026.      Total face to face time spent with pt was 41 minutes.  Return in about 11 months (around 02/10/2025) for Annual Physical.   Mandy Second, PA
